# Patient Record
Sex: Male | Born: 1965 | Race: White | Hispanic: No | Marital: Married | Smoking: Never smoker
Health system: Southern US, Community
[De-identification: ages and names within clinical notes are randomized; demographics above are authoritative.]

## PROBLEM LIST (undated history)

## (undated) DIAGNOSIS — I4891 Unspecified atrial fibrillation: Secondary | ICD-10-CM

## (undated) DIAGNOSIS — G4730 Sleep apnea, unspecified: Secondary | ICD-10-CM

## (undated) HISTORY — DX: Unspecified atrial fibrillation: I48.91

## (undated) HISTORY — PX: OTHER SURGICAL HISTORY: SHX169

## (undated) HISTORY — PX: APPENDECTOMY: SHX54

---

## 2005-12-14 ENCOUNTER — Ambulatory Visit (HOSPITAL_COMMUNITY): Admission: RE | Admit: 2005-12-14 | Discharge: 2005-12-14 | Payer: Self-pay | Admitting: Internal Medicine

## 2005-12-18 ENCOUNTER — Ambulatory Visit: Payer: Self-pay | Admitting: Internal Medicine

## 2006-01-01 ENCOUNTER — Ambulatory Visit: Payer: Self-pay

## 2006-01-01 ENCOUNTER — Encounter: Payer: Self-pay | Admitting: Cardiology

## 2006-01-11 ENCOUNTER — Ambulatory Visit: Payer: Self-pay | Admitting: Internal Medicine

## 2007-03-12 ENCOUNTER — Emergency Department: Payer: Self-pay | Admitting: Emergency Medicine

## 2007-03-12 ENCOUNTER — Other Ambulatory Visit: Payer: Self-pay

## 2007-12-13 ENCOUNTER — Emergency Department (HOSPITAL_COMMUNITY): Admission: EM | Admit: 2007-12-13 | Discharge: 2007-12-13 | Payer: Self-pay | Admitting: Emergency Medicine

## 2009-05-31 ENCOUNTER — Emergency Department (HOSPITAL_COMMUNITY): Admission: EM | Admit: 2009-05-31 | Discharge: 2009-05-31 | Payer: Self-pay | Admitting: Emergency Medicine

## 2014-04-25 ENCOUNTER — Other Ambulatory Visit (HOSPITAL_COMMUNITY): Payer: Self-pay | Admitting: Physician Assistant

## 2014-04-25 ENCOUNTER — Ambulatory Visit (HOSPITAL_COMMUNITY)
Admission: RE | Admit: 2014-04-25 | Discharge: 2014-04-25 | Disposition: A | Discharge status: Home / Self Care | Payer: BC Managed Care – PPO | Source: Ambulatory Visit | Attending: Physician Assistant | Admitting: Physician Assistant

## 2014-04-25 DIAGNOSIS — M47812 Spondylosis without myelopathy or radiculopathy, cervical region: Secondary | ICD-10-CM | POA: Insufficient documentation

## 2014-04-25 DIAGNOSIS — R5381 Other malaise: Secondary | ICD-10-CM

## 2014-04-25 DIAGNOSIS — G43909 Migraine, unspecified, not intractable, without status migrainosus: Secondary | ICD-10-CM

## 2014-04-25 DIAGNOSIS — R5383 Other fatigue: Secondary | ICD-10-CM

## 2014-04-25 DIAGNOSIS — R079 Chest pain, unspecified: Secondary | ICD-10-CM

## 2015-04-03 ENCOUNTER — Encounter: Payer: Self-pay | Admitting: *Deleted

## 2015-04-05 ENCOUNTER — Encounter: Payer: Self-pay | Admitting: Urology

## 2015-04-05 ENCOUNTER — Ambulatory Visit (INDEPENDENT_AMBULATORY_CARE_PROVIDER_SITE_OTHER): Payer: BC Managed Care – PPO | Admitting: Urology

## 2015-04-05 VITALS — BP 131/80 | HR 77 | Ht 72.0 in | Wt 170.3 lb

## 2015-04-05 DIAGNOSIS — Z309 Encounter for contraceptive management, unspecified: Secondary | ICD-10-CM

## 2015-04-05 DIAGNOSIS — Z3009 Encounter for other general counseling and advice on contraception: Secondary | ICD-10-CM

## 2015-04-05 MED ORDER — DIAZEPAM 10 MG PO TABS: 10.0000 mg | ORAL_TABLET | Freq: Once | ORAL | Status: DC

## 2015-04-05 NOTE — Progress Notes (Signed)
04/05/2015 1:02 PM   Martin Becker July 12, 1966 [id number]  Referring provider: Shawnie Dapper, PA-C [address] [place], [place] [address]  Chief Complaint  Patient presents with  . Advice Only    Vasectomy    HPI: Martin Becker is a 49 y/o white male who presents today with his girlfriend desiring a vasectomy for birth control.  He has two children and he and his girlfriend do not desire any more children.  He has read the vasectomy literature.  I have explained the risk factors and answered his questions.  I reiterated that this is a permanent form of birth control.  I also explained the post procedural course and what he could expect after the procedure.  He will have a Valium prescribed and his girlfriend will drive him to the appointment and drive him home that day.    He does not have a family history of PCa, so PSA screening is not warranted at this time.    PMH: Past Medical History  Diagnosis Date  . Atrial fibrillation     fixed by ablation    Surgical History: Past Surgical History  Procedure Laterality Date  . Heart ablation      Home Medications:    Medication List       This list is accurate as of: 04/05/15 11:59 PM.  Always use your most recent med list.               acetaminophen 325 MG tablet  Commonly known as:  TYLENOL  Take 650 mg by mouth every 6 (six) hours as needed.     aspirin-acetaminophen-caffeine 250-250-65 MG per tablet  Commonly known as:  EXCEDRIN MIGRAINE  Take by mouth every 6 (six) hours as needed for headache.     diazepam 10 MG tablet  Commonly known as:  VALIUM  Take 1 tablet (10 mg total) by mouth once.     MULTI VITAMIN MENS PO  Take by mouth daily.        Allergies:  Allergies  Allergen Reactions  . Penicillins Nausea And Vomiting    Family History: No family history on file.  Social History:  reports that he has never smoked. He does not have any smokeless tobacco history on file. He reports that  he does not drink alcohol or use illicit drugs.  ROS: Urological Symptom Review  Patient is experiencing the following symptoms: Frequent urination Get up at night to urinate   Review of Systems  Gastrointestinal (upper)  : Negative for upper GI symptoms  Gastrointestinal (lower) : Negative for lower GI symptoms  Constitutional : Negative for symptoms  Skin: Negative for skin symptoms  Eyes: Negative for eye symptoms  Ear/Nose/Throat : Negative for Ear/Nose/Throat symptoms  Hematologic/Lymphatic: Negative for Hematologic/Lymphatic symptoms  Cardiovascular : Negative for cardiovascular symptoms  Respiratory : Negative for respiratory symptoms  Endocrine: Negative for endocrine symptoms  Musculoskeletal: Negative for musculoskeletal symptoms  Neurological: Negative for neurological symptoms  Psychologic: Negative for psychiatric symptoms   Physical Exam: BP 131/80 mmHg  Pulse 77  Ht 6' (1.829 m)  Wt 170 lb 4.8 oz (77.248 kg)  BMI 23.09 kg/m2  GU: Patient with an uncircumcised phallus. Foreskin easily retracted.  Urethral meatus is patent.  No penile discharge. No penile lesions or rashes. Scrotum without lesions, cysts, rashes and/or edema.  Testicles are located scrotally bilaterally. No masses are appreciated in the testicles. Left and right epididymis are normal.  Laboratory Data: No results found for:  WBC, HGB, HCT, MCV, PLT  No results found for: CREATININE  No results found for: PSA  No results found for: TESTOSTERONE  No results found for: HGBA1C  Urinalysis No results found for: COLORURINE, APPEARANCEUR, LABSPEC, PHURINE, GLUCOSEU, HGBUR, BILIRUBINUR, KETONESUR, PROTEINUR, UROBILINOGEN, NITRITE, LEUKOCYTESUR  Pertinent Imaging:   Assessment & Plan:   Discussion: Vasectomy Consult Note Previous fertility: YES Number of children: TWO Current partner: GIRLFRIEND Previous forms of birth control: OCP History of chronic prostatitis,  epididymitis, orchitis, or other genital pain: NONE Today, we discussed what the vas deferens is, where it is located, and its function. We reviewed the procedure for vasectomy, it's risks, benefits, alternatives, and likelihood of achieving his goals. We discussed in detail the procedure, complications, and recovery as well as the need for clearance prior to unprotected intercourse. We discussed that vasectomy does not protect against sexually transmitted diseases. We discussed that this procedure does not result in immediate sterility and that they would need to use other forms of birth control until he has been cleared with a three month negative postvasectomy semen analyses. I explained that the procedure is considered to be permanent and that attempts at reversal have varying degrees of success. These options include vasectomy reversal, sperm retrieval, and in vitro fertilization; these can be very expensive. We discussed the chance of postvasectomy pain syndrome which occurs in less than 5% of patients. I explained to the patient that there is no treatment to resolve this chronic pain, and that if it developed I would not be able to help resolve the issue, but that surgery is generally not needed for correction. I explained there have even been reports of systemic like illness associated with this chronic pain, and that there was no good cure. I explained that vasectomy it is not a 100% reliable form of birth control, and the risk of pregnancy after vasectomy is approximately 1 in 2000 men who had a negative postvasectomy semen analysis or rare non-motile sperm. I explained that repeat vasectomy was necessary in less than 1% of vasectomy procedures when employing the type of technique that is preformed in this office. I explained that he should refrain from ejaculation for approximately one week following vasectomy. I explained that there are other options for birth control which are permanent and  non-permanent; we discussed these. I explained the rates of surgical complications, such as symptomatic hematoma or infection, are low (1-2%) and vary with the surgeon's experience and criteria used to diagnose the complication.  The patient had the opportunity to ask questions to his stated satisfaction. He voiced understanding of the above factors and stated that he has read all the information provided to him and the packets and informed consent.   There are no diagnoses linked to this encounter.  No Follow-up on file.  Michiel Cowboy, PA-C  [facility] [address] [place], [place] [address] [phone]

## 2015-05-03 ENCOUNTER — Ambulatory Visit (INDEPENDENT_AMBULATORY_CARE_PROVIDER_SITE_OTHER): Payer: BC Managed Care – PPO | Admitting: Urology

## 2015-05-03 VITALS — BP 131/86 | HR 68 | Ht 72.0 in | Wt 167.4 lb

## 2015-05-03 DIAGNOSIS — Z302 Encounter for sterilization: Secondary | ICD-10-CM | POA: Diagnosis not present

## 2015-05-03 NOTE — Progress Notes (Signed)
Bilateral Vasectomy Procedure  Pre-Procedure: - Patient's scrotum was prepped and draped for vasectomy. - The vas was palpated through the scrotal skin on the left. - 1% Xylocaine was injected into the skin and surrounding tissue for placement  - In a similar manner, the vas on the right was identified, anesthetized, and stabilized.  Procedure: - A blade technique was used to open the overlying skin (sharp dissection) - The left vas was isolated and brought up through the incision exposing that structure. - Bleeding points were cauterized as they occurred. - The vas was free from the surrounding structures and brought to the view. - A segment was positioned for placement with a hemostat. - A second hemostat was placed and a small segment between the two hemostats and was removed for inspection. - Each end of the transected vas lumen was fulgurated/ obliterated using needlepoint electrocautery -A fascial interposition was performed on testicular end of the vas using #3-0 chromic suture -The same procedure was performed on the right. - A single suture of #3-0 chromic catgut was used to close each midline scrotal skin incision - A dressing was applied.  Post-Procedure: - Patient was instructed in care of the operative area - A specimen is to be delivered in 12 weeks   -Another form of contraception is to be used until

## 2015-05-03 NOTE — Progress Notes (Signed)
Patient is here for as ectomy. Thorough review of palpitations as ectomy including infection bleeding reanastomosis and excessive cost of 3 anastomosis have been reviewed with the patient.  Physical exam testes descended and normal masses easily felt no vaginitis. Vital signs stable. Permit signed and timeout done.

## 2015-07-23 ENCOUNTER — Other Ambulatory Visit: Payer: BC Managed Care – PPO

## 2015-07-23 DIAGNOSIS — N469 Male infertility, unspecified: Secondary | ICD-10-CM

## 2015-07-24 LAB — POST-VAS SPERM EVALUATION,QUAL
Post-Vas Sperm, Qual: ABSENT
SEMEN VOLUME: 2.1 mL

## 2015-07-25 ENCOUNTER — Telehealth: Payer: Self-pay

## 2015-07-25 NOTE — Telephone Encounter (Signed)
-----   Message from Harle Battiest, PA-C sent at 07/25/2015  8:32 AM EDT ----- Patient's semen analysis is negative for sperm.

## 2015-07-25 NOTE — Telephone Encounter (Signed)
Spoke with pt in reference to semen analysis. Pt voiced understanding.  

## 2016-08-18 ENCOUNTER — Encounter: Payer: Self-pay | Admitting: Cardiovascular Disease

## 2016-08-23 ENCOUNTER — Encounter (HOSPITAL_COMMUNITY): Payer: Self-pay | Admitting: *Deleted

## 2016-08-23 ENCOUNTER — Telehealth (HOSPITAL_COMMUNITY): Payer: Self-pay

## 2016-08-23 ENCOUNTER — Emergency Department (HOSPITAL_COMMUNITY): Payer: BC Managed Care – PPO

## 2016-08-23 ENCOUNTER — Emergency Department (HOSPITAL_COMMUNITY)
Admission: EM | Admit: 2016-08-23 | Discharge: 2016-08-23 | Disposition: A | Discharge status: Home / Self Care | Payer: BC Managed Care – PPO | Attending: Emergency Medicine | Admitting: Emergency Medicine

## 2016-08-23 DIAGNOSIS — R0789 Other chest pain: Secondary | ICD-10-CM | POA: Diagnosis present

## 2016-08-23 DIAGNOSIS — Z7982 Long term (current) use of aspirin: Secondary | ICD-10-CM | POA: Diagnosis not present

## 2016-08-23 LAB — CBC
HCT: 45.7 % (ref 39.0–52.0)
Hemoglobin: 16.6 g/dL (ref 13.0–17.0)
MCH: 30.1 pg (ref 26.0–34.0)
MCHC: 36.3 g/dL — AB (ref 30.0–36.0)
MCV: 82.9 fL (ref 78.0–100.0)
PLATELETS: 200 10*3/uL (ref 150–400)
RBC: 5.51 MIL/uL (ref 4.22–5.81)
RDW: 12.1 % (ref 11.5–15.5)
WBC: 8.2 10*3/uL (ref 4.0–10.5)

## 2016-08-23 LAB — BASIC METABOLIC PANEL
Anion gap: 6 (ref 5–15)
BUN: 12 mg/dL (ref 6–20)
CHLORIDE: 104 mmol/L (ref 101–111)
CO2: 27 mmol/L (ref 22–32)
CREATININE: 0.94 mg/dL (ref 0.61–1.24)
Calcium: 9.6 mg/dL (ref 8.9–10.3)
GFR calc non Af Amer: >60 mL/min (ref >60)
GLUCOSE: 82 mg/dL (ref 65–99)
Potassium: 3.9 mmol/L (ref 3.5–5.1)
Sodium: 137 mmol/L (ref 135–145)

## 2016-08-23 LAB — I-STAT TROPONIN, ED
Troponin i, poc: 0 ng/mL (ref 0.00–0.08)
Troponin i, poc: 0 ng/mL (ref 0.00–0.08)

## 2016-08-23 MED ORDER — GI COCKTAIL ~~LOC~~
30.0000 mL | Freq: Once | ORAL | Status: AC
  Administered 2016-08-23: 30 mL via ORAL
  Filled 2016-08-23: qty 30

## 2016-08-23 MED ORDER — GI COCKTAIL ~~LOC~~: 30.0000 mL | Freq: Once | ORAL | 1 refills | Status: AC | PRN

## 2016-08-23 MED ORDER — FAMOTIDINE 40 MG PO TABS: 40.0000 mg | ORAL_TABLET | Freq: Every day | ORAL | 0 refills | Status: DC

## 2016-08-23 NOTE — Telephone Encounter (Signed)
Pharmacy calling for clarification of contents in GI cocktail.  [facility] contents per 30 ml of GI cocktail include 12 ml Maalox, 12 ml Visc lidocaine and 6 ml Donnatal.  Pharmacy informed.

## 2016-08-23 NOTE — ED Provider Notes (Signed)
[facility]-EMERGENCY DEPT Provider Note   CSN: [id number] Arrival date & time: 08/23/16  1308     History   Chief Complaint Chief Complaint  Patient presents with  . Chest Pain    HPI Martin Becker is a 50 y.o. male who presents with chest pressure. PMH significant for SVT s/p ablation in his late 4920s. He states that the pressure started one week ago. It is constant, worse when he lies flat or reclines. Pain is not worse with exertion. Denies fever, chills, sweats, palpitations, leg swelling, SOB, cough, abdominal pain, N/V. He went to his PCP 5 days ago who did an EKG and blood work. They thought it may be GI related and put him on Omeprazole once daily. He states that this med has not helped much. They scheduled him for outpatient stress test this upcoming Tuesday. Today the pressure moved to the upper left chest and in to the L shoulder today and he was unable to do his daily push ups due to pain therefore came to the ED for further evaluation. Denies hx of HTN, HLD, DM, and is a non-smoker. Never had a cath. Last stress test and echo was in 2007 which was unremarkable. Patient states he is worried because his father had many problems with his heart therefore he tries to take care of himself.  HPI  Past Medical History:  Diagnosis Date  . Atrial fibrillation (HCC)    fixed by ablation    There are no active problems to display for this patient.   Past Surgical History:  Procedure Laterality Date  . APPENDECTOMY    . heart ablation         Home Medications    Prior to Admission medications   Medication Sig Start Date End Date Taking? Authorizing Provider  acetaminophen (TYLENOL) 325 MG tablet Take 650 mg by mouth every 6 (six) hours as needed.    Historical Provider, MD  aspirin-acetaminophen-caffeine (EXCEDRIN MIGRAINE) [id number] MG per tablet Take by mouth every 6 (six) hours as needed for headache.    Historical Provider, MD  diazepam (VALIUM) 10 MG tablet Take 1  tablet (10 mg total) by mouth once. 04/05/15   Harle BattiestShannon A McGowan, PA-C  Multiple Vitamin (MULTI VITAMIN MENS PO) Take by mouth daily.    Historical Provider, MD    Family History No family history on file.  Social History Social History  Substance Use Topics  . Smoking status: Never Smoker  . Smokeless tobacco: Never Used  . Alcohol use No     Allergies   Penicillins   Review of Systems Review of Systems  Constitutional: Negative for chills, diaphoresis and fever.  Respiratory: Negative for cough and shortness of breath.   Cardiovascular: Positive for chest pain. Negative for palpitations and leg swelling.  Gastrointestinal: Negative for abdominal pain, nausea and vomiting.  All other systems reviewed and are negative.    Physical Exam Updated Vital Signs BP (!) 160/117 (BP Location: Right Arm)   Pulse 66   Temp 98.1 F (36.7 C) (Oral)   Resp 16   SpO2 98%   Physical Exam  Constitutional: He is oriented to person, place, and time. He appears well-developed and well-nourished. No distress.  HENT:  Head: Normocephalic and atraumatic.  Eyes: Conjunctivae are normal. Pupils are equal, round, and reactive to light. Right eye exhibits no discharge. Left eye exhibits no discharge. No scleral icterus.  Neck: Normal range of motion. Neck supple.  Cardiovascular: Normal rate and regular rhythm.  Exam reveals no gallop and no friction rub.   No murmur heard. Pulmonary/Chest: Effort normal and breath sounds normal. No respiratory distress. He has no wheezes. He has no rales. He exhibits no tenderness.  Abdominal: Soft. Bowel sounds are normal. He exhibits no distension and no mass. There is no tenderness. There is no rebound and no guarding. No hernia.  Musculoskeletal: He exhibits no edema.  Neurological: He is alert and oriented to person, place, and time.  Skin: Skin is warm and dry.  Psychiatric: He has a normal mood and affect.  Nursing note and vitals reviewed.    ED  Treatments / Results  Labs (all labs ordered are listed, but only abnormal results are displayed) Labs Reviewed  CBC - Abnormal; Notable for the following:       Result Value   MCHC 36.3 (*)    All other components within normal limits  BASIC METABOLIC PANEL  I-STAT TROPOININ, ED  I-STAT TROPOININ, ED    EKG  EKG Interpretation  Date/Time:  Sunday August 23 2016 13:11:03 EDT Ventricular Rate:  74 PR Interval:  164 QRS Duration: 100 QT Interval:  382 QTC Calculation: 424 R Axis:   71 Text Interpretation:  Normal sinus rhythm Incomplete right bundle branch block Borderline ECG no significant change since 2008 Confirmed by GOLDSTON MD, SCOTT [phone]([id number]) on 08/23/2016 1:51:46 PM       Radiology Dg Chest 2 View  Result Date: 08/23/2016 CLINICAL DATA:  Intermittent left chest pain for the past 2 weeks, worse today. Left arm weakness. EXAM: CHEST  2 VIEW COMPARISON:  04/25/2014. FINDINGS: Normal sized heart. Clear lungs with normal vascularity. Normal appearing bones. IMPRESSION: Normal examination. Electronically Signed   By: Beckie SaltsSteven  Reid M.D.   On: 08/23/2016 14:23    Procedures Procedures (including critical care time)  Medications Ordered in ED Medications  gi cocktail (Maalox,Lidocaine,Donnatal) (30 mLs Oral Given 08/23/16 1542)     Initial Impression / Assessment and Plan / ED Course  I have reviewed the triage vital signs and the nursing notes.  Pertinent labs & imaging results that were available during my care of the patient were reviewed by me and considered in my medical decision making (see chart for details).  Clinical Course   50 year old male with atypical chest pain. Chest pain work up is reassuring. EKG is NSR and shows no significant change since last. CXR is negative. Initial and second troponin are 0. Labs are unremarkable. No significant past hx of cardiac disease. Patient is non-smoker. HEART score is 2. PERC neg. GI cocktail given and patient reports  significant relief. F/u with PCP for stress test on Tuesday. Patient is NAD, non-toxic, with stable VS. Patient is informed of clinical course, understands medical decision making process, and agrees with plan. Opportunity for questions provided and all questions answered. Return precautions given.   Final Clinical Impressions(s) / ED Diagnoses   Final diagnoses:  Atypical chest pain    New Prescriptions New Prescriptions   No medications on file     Bethel BornKelly Marie Versia Mignogna, PA-C 08/23/16 1935    Pricilla LovelessScott Goldston, MD 09/01/16 1704

## 2016-08-23 NOTE — Discharge Instructions (Signed)
Take Pepcid once a day. Take GI cocktail as needed Follow up with PCP for stress test

## 2016-08-23 NOTE — ED Triage Notes (Signed)
Pt states intermittent chest heaviness off and on for the last week.  Pt has been on omeprazole and no change and is scheduled for an outpt stress test. Pt feels like weakness ot left shoulder area, no grip deficits. Pt has had ablation before

## 2016-08-25 ENCOUNTER — Ambulatory Visit: Payer: BC Managed Care – PPO | Admitting: Internal Medicine

## 2018-05-08 ENCOUNTER — Emergency Department
Admission: EM | Admit: 2018-05-08 | Discharge: 2018-05-08 | Disposition: A | Discharge status: Home / Self Care | Payer: BC Managed Care – PPO | Attending: Emergency Medicine | Admitting: Emergency Medicine

## 2018-05-08 ENCOUNTER — Emergency Department: Payer: BC Managed Care – PPO

## 2018-05-08 ENCOUNTER — Other Ambulatory Visit: Payer: Self-pay

## 2018-05-08 DIAGNOSIS — Z79899 Other long term (current) drug therapy: Secondary | ICD-10-CM | POA: Diagnosis not present

## 2018-05-08 DIAGNOSIS — R079 Chest pain, unspecified: Secondary | ICD-10-CM | POA: Insufficient documentation

## 2018-05-08 HISTORY — DX: Sleep apnea, unspecified: G47.30

## 2018-05-08 LAB — CBC
HCT: 43.7 % (ref 40.0–52.0)
Hemoglobin: 15.7 g/dL (ref 13.0–18.0)
MCH: 30.2 pg (ref 26.0–34.0)
MCHC: 36 g/dL (ref 32.0–36.0)
MCV: 83.8 fL (ref 80.0–100.0)
PLATELETS: 200 10*3/uL (ref 150–440)
RBC: 5.21 MIL/uL (ref 4.40–5.90)
RDW: 12.8 % (ref 11.5–14.5)
WBC: 8.4 10*3/uL (ref 3.8–10.6)

## 2018-05-08 LAB — BASIC METABOLIC PANEL
Anion gap: 8 (ref 5–15)
BUN: 15 mg/dL (ref 6–20)
CALCIUM: 9.1 mg/dL (ref 8.9–10.3)
CHLORIDE: 107 mmol/L (ref 98–111)
CO2: 25 mmol/L (ref 22–32)
CREATININE: 1.02 mg/dL (ref 0.61–1.24)
GFR calc Af Amer: >60 mL/min (ref >60)
GFR calc non Af Amer: >60 mL/min (ref >60)
GLUCOSE: 132 mg/dL — AB (ref 70–99)
Potassium: 3.9 mmol/L (ref 3.5–5.1)
Sodium: 140 mmol/L (ref 135–145)

## 2018-05-08 LAB — TROPONIN I
Troponin I: <0.03 ng/mL (ref <0.03)
Troponin I: <0.03 ng/mL (ref <0.03)

## 2018-05-08 MED ORDER — GI COCKTAIL ~~LOC~~
30.0000 mL | Freq: Once | ORAL | Status: AC
  Administered 2018-05-08: 30 mL via ORAL
  Filled 2018-05-08: qty 30

## 2018-05-08 MED ORDER — ASPIRIN 81 MG PO CHEW
324.0000 mg | CHEWABLE_TABLET | Freq: Once | ORAL | Status: AC
  Administered 2018-05-08: 324 mg via ORAL
  Filled 2018-05-08: qty 4

## 2018-05-08 NOTE — ED Provider Notes (Signed)
[facility] Emergency Department Provider Note ____________________________________________   First MD Initiated Contact with Patient 05/08/18 1510     (approximate)  I have reviewed the triage vital signs and the nursing notes.   HISTORY  Chief Complaint Chest Pain  HPI Martin Becker is a 52 y.o. male here for evaluation of chest discomfort.  Patient reports he is been experiencing a pressure in the left side of his chest for about the last 24 hours.  It radiates slightly towards his left upper chest, feels like a very slight tingling at times in his arm.  Does report he is a history of similar symptoms with acid reflux previously, reports when this happened before he was given a GI cocktail and started Protonix and that helped.  He currently occasionally takes acid medicine, took 1 tablet earlier today.  No other medications.  Reports a improvement but he still mild ongoing discomfort left upper chest.   Past Medical History:  Diagnosis Date  . Atrial fibrillation (HCC)    fixed by ablation  . Sleep apnea     There are no active problems to display for this patient.   Past Surgical History:  Procedure Laterality Date  . APPENDECTOMY    . heart ablation      Prior to Admission medications   Medication Sig Start Date End Date Taking? Authorizing Provider  Alum & Mag Hydroxide-Simeth (GI COCKTAIL) SUSP suspension Take 30 mLs by mouth once as needed for indigestion. Shake well. 08/23/16   Bethel BornGekas, Kelly Marie, PA-C  B Complex-C (B-COMPLEX WITH VITAMIN C) tablet Take 1 tablet by mouth daily.    [provider]  famotidine (PEPCID) 40 MG tablet Take 1 tablet (40 mg total) by mouth daily. 08/23/16   Bethel BornGekas, Kelly Marie, PA-C  Multiple Vitamin (MULTI VITAMIN MENS PO) Take by mouth daily.    [provider]  omeprazole (PRILOSEC) 40 MG capsule Take 40 mg by mouth daily.    [provider]    Allergies Penicillins  History  reviewed. No pertinent family history.  Social History Social History   Tobacco Use  . Smoking status: Never Smoker  . Smokeless tobacco: Never Used  Substance Use Topics  . Alcohol use: No    Alcohol/week: 0.0 oz  . Drug use: No    Review of Systems Constitutional: No fever/chills Eyes: No visual changes. ENT: No sore throat. Cardiovascular: See HPI  respiratory: Denies shortness of breath. Gastrointestinal: No abdominal pain.  No nausea, no vomiting.  No diarrhea.  No constipation. Genitourinary: Negative for dysuria. Musculoskeletal: Negative for back pain. Skin: Negative for rash. Neurological: Negative for headaches, focal weakness or numbness.  No pain with deep inspiration. ____________________________________________   PHYSICAL EXAM:  VITAL SIGNS: ED Triage Vitals  Enc Vitals Group     BP 05/08/18 1257 (!) 155/90     Pulse Rate 05/08/18 1257 72     Resp 05/08/18 1257 18     Temp 05/08/18 1257 98 F (36.7 C)     Temp Source 05/08/18 1257 Oral     SpO2 05/08/18 1257 100 %     Weight 05/08/18 1254 195 lb (88.5 kg)     Height 05/08/18 1254 6' (1.829 m)     Head Circumference --      Peak Flow --      Pain Score 05/08/18 1254 6     Pain Loc --      Pain Edu? --  Excl. in GC? --     Constitutional: Alert and oriented. Well appearing and in no acute distress. Eyes: Conjunctivae are normal. Head: Atraumatic. Nose: No congestion/rhinnorhea. Mouth/Throat: Mucous membranes are moist. Neck: No stridor.   Cardiovascular: Normal rate, regular rhythm. Grossly normal heart sounds.  Good peripheral circulation. Respiratory: Normal respiratory effort.  No retractions. Lungs CTAB. Gastrointestinal: Soft and nontender. No distention.  No epigastric tenderness.  No pain in the right upper quadrant.  Negative Murphy. Musculoskeletal: No lower extremity tenderness nor edema. Neurologic:  Normal speech and language. No gross focal neurologic deficits are appreciated.   Skin:  Skin is warm, dry and intact. No rash noted. Psychiatric: Mood and affect are normal. Speech and behavior are normal.  ____________________________________________   LABS (all labs ordered are listed, but only abnormal results are displayed)  Labs Reviewed  BASIC METABOLIC PANEL - Abnormal; Notable for the following components:      Result Value   Glucose, Bld 132 (*)    All other components within normal limits  CBC  TROPONIN I  TROPONIN I   ____________________________________________  EKG  Reviewed and interpreted by me at 1510 Heart rate 70 QRS 100 QTc 440 Normal sinus rhythm, somewhat of an incomplete right bundle branch block appearance is noted, and there is a nonspecific T wave including some very minimal ST change in V2 with less than 1 mm ST elevation.  It appears somewhat similar to his previous EKG from October 2017, but there is now a slight nonspecific change  EKG repeated at 1600 for observation Heart rate 60 QRS 110 QTc 410 Normal sinus rhythm, again seen right bundle branch block appearance, again seen a mild persistent ST abnormality in V2.  Compared with prior from today, no significant changes noted at this time ____________________________________________  RADIOLOGY    Chest x-ray is negative for acute ____________________________________________   PROCEDURES  Procedure(s) performed: None  Procedures  Critical Care performed: No  ____________________________________________   INITIAL IMPRESSION / ASSESSMENT AND PLAN / ED COURSE  Pertinent labs & imaging results that were available during my care of the patient were reviewed by me and considered in my medical decision making (see chart for details).    Differential diagnosis includes, but is not limited to, ACS, aortic dissection, pulmonary embolism, cardiac tamponade, pneumothorax, pneumonia, pericarditis, myocarditis, GI-related causes including esophagitis/gastritis, and  musculoskeletal chest wall pain.     Clinical Course as of May 08 1718  Sun May 08, 2018  1709 Trop 2 normal, discussed with Dr. Herbie Baltimore (cardiology). Reviewed clinical history, Dr. Herbie Baltimore reviewed EKGs, labs, etc via [facility] with me. Recommends discharge, follow-up with cardiology outpatient. Daily asa therapy. Follow-up with [facility] cardiology. Dr. Herbie Baltimore will have scheduler call patient for appointment.    [MQ]    Clinical Course User Index [MQ] Sharyn Creamer, MD   ----------------------------------------- 5:18 PM on 05/08/2018 -----------------------------------------  Patient reports feeling improved after GI cocktail.  Resting comfortably.  Reassuring EKGs and troponins.  2 normal troponins.  Return precautions and treatment recommendations and follow-up discussed with the patient who is agreeable with the plan.   ____________________________________________   FINAL CLINICAL IMPRESSION(S) / ED DIAGNOSES  Final diagnoses:  Chest pain, unspecified type      NEW MEDICATIONS STARTED DURING THIS VISIT:  New Prescriptions   No medications on file     Note:  This document was prepared using Dragon voice recognition software and may include unintentional dictation errors.     Sharyn Creamer, MD 05/08/18 1719

## 2018-05-08 NOTE — ED Triage Notes (Signed)
Pt arrives to ED c/o of L CP that began today radiating into L shoulder. Some nausea. Denies vomiting. Alert, oriented, ambulatory. States "the humidity has really been bothering me the past couple days." hx GERD. Took "a stomach pill" this morning. States a lot of burping and gas as well.

## 2018-05-08 NOTE — Discharge Instructions (Addendum)

## 2018-05-09 ENCOUNTER — Telehealth: Payer: Self-pay | Admitting: Internal Medicine

## 2018-05-09 NOTE — Telephone Encounter (Signed)
TCM....  Patient is being discharged   They saw Dr Herbie BaltimoreHarding   They are scheduled to see Dr Graciela HusbandsKlein on 05/12/18  They were seen for CP  They need to be seen within 7-10 days      Please call

## 2018-05-09 NOTE — Telephone Encounter (Signed)
Patient contacted [facility] on 05/08/18.  Patient understands to follow up with provider Dr. Graciela HusbandsKlein on Thursday 05/12/18 at [facility]. Patient understands discharge instructions? Yes Patient understands medications and regiment? Yes Patient understands to bring all medications to this visit? Yes

## 2018-05-12 ENCOUNTER — Encounter: Payer: Self-pay | Admitting: Internal Medicine

## 2018-05-12 ENCOUNTER — Ambulatory Visit: Payer: BC Managed Care – PPO | Admitting: Internal Medicine

## 2018-05-12 VITALS — BP 142/100 | HR 69 | Ht 72.0 in | Wt 196.8 lb

## 2018-05-12 DIAGNOSIS — R079 Chest pain, unspecified: Secondary | ICD-10-CM

## 2018-05-12 NOTE — Patient Instructions (Signed)
Medication Instructions:  Your physician recommends that you continue on your current medications as directed. Please refer to the Current Medication list given to you today.   Labwork: none  Testing/Procedures: none  Follow-Up: Your physician recommends that you schedule a follow-up appointment in: as needed.    

## 2018-05-12 NOTE — Progress Notes (Signed)
ELECTROPHYSIOLOGY OFFICE NOTE  Patient ID: Martin Becker, MRN: [id number], DOB/AGE: 1966/07/20 52 y.o. Admit date: (Not on file) Date of Consult: 05/12/2018  Primary Physician: Patient, No Pcp Per Primary Cardiologist: none     Martin Becker is a 52 y.o. male who is being seen today for the evaluation of CP  at the request of ER .    HPI Martin Becker is a 52 y.o. male referred for evaluation of chest pain.  Seen in ER 05/08/2018 for chest pain of 6-8  hours duration.  It was left-sided with radiation into his arm.  Previously ascribed to reflux.  Improved with a GI cocktail troponins were negative x2 and he was discharged.     I apparently saw him about 12 years ago for palpitations.  He underwent an ablation procedure that was quite complicated apparently and I did with somebody else.  Since that time he has had no recurrent tachycardia.  Echocardiogram at that time was normal.  His weight is up about 20 pounds.  He has had hypertension.  He has been identified as having sleep apnea and is supposed to get his CPAP machine tonight.  Past Medical History:  Diagnosis Date  . Atrial fibrillation (HCC)    fixed by ablation  . Sleep apnea       Surgical History:  Past Surgical History:  Procedure Laterality Date  . APPENDECTOMY    . heart ablation       Home Meds: Prior to Admission medications   Medication Sig Start Date End Date Taking? Authorizing Provider  Alum & Mag Hydroxide-Simeth (GI COCKTAIL) SUSP suspension Take 30 mLs by mouth once as needed for indigestion. Shake well. 08/23/16  Yes Bethel Born, PA-C  B Complex-C (B-COMPLEX WITH VITAMIN C) tablet Take 1 tablet by mouth daily.   Yes [provider]  Multiple Vitamin (MULTI VITAMIN MENS PO) Take by mouth daily.   Yes [provider]  omeprazole (PRILOSEC) 40 MG capsule Take 40 mg by mouth daily.   Yes [provider]    Allergies:  Allergies  Allergen Reactions  .  Penicillins Nausea And Vomiting    Social History   Socioeconomic History  . Marital status: Single    Spouse name: Not on file  . Number of children: Not on file  . Years of education: Not on file  . Highest education level: Not on file  Occupational History  . Not on file  Social Needs  . Financial resource strain: Not on file  . Food insecurity:    Worry: Not on file    Inability: Not on file  . Transportation needs:    Medical: Not on file    Non-medical: Not on file  Tobacco Use  . Smoking status: Never Smoker  . Smokeless tobacco: Never Used  Substance and Sexual Activity  . Alcohol use: No    Alcohol/week: 0.0 oz  . Drug use: No  . Sexual activity: Not on file  Lifestyle  . Physical activity:    Days per week: Not on file    Minutes per session: Not on file  . Stress: Not on file  Relationships  . Social connections:    Talks on phone: Not on file    Gets together: Not on file    Attends religious service: Not on file    Active member of club or organization: Not on file    Attends meetings of clubs or  organizations: Not on file    Relationship status: Not on file  . Intimate partner violence:    Fear of current or ex partner: Not on file    Emotionally abused: Not on file    Physically abused: Not on file    Forced sexual activity: Not on file  Other Topics Concern  . Not on file  Social History Narrative  . Not on file     History reviewed. No pertinent family history.   ROS:  Please see the history of present illness.     All other systems reviewed and negative.    Physical Exam: Blood pressure (!) 142/100, pulse 69, height 6' (1.829 m), weight 196 lb 12 oz (89.2 kg). General: Well developed, well nourished male in no acute distress. Head: Normocephalic, atraumatic, sclera non-icteric, no xanthomas, nares are without discharge. EENT: normal  Lymph Nodes:  none Neck: Negative for carotid bruits. JVD not elevated. Back:without scoliosis  kyphosis Lungs: Clear bilaterally to auscultation without wheezes, rales, or rhonchi. Breathing is unlabored. Heart: RRR with S1 S2. No  murmur . No rubs, or gallops appreciated. Abdomen: Soft, non-tender, non-distended with normoactive bowel sounds. No hepatomegaly. No rebound/guarding. No obvious abdominal masses. Msk:  Strength and tone appear normal for age. Extremities: No clubbing or cyanosis. No edema.  Distal pedal pulses are 2+ and equal bilaterally. Skin: Warm and Dry Neuro: Alert and oriented X 3. CN III-XII intact Grossly normal sensory and motor function . Psych:  Responds to questions appropriately with a normal affect.      Labs: Cardiac Enzymes No results for input(s): CKTOTAL, CKMB, TROPONINI in the last 72 hours. CBC Lab Results  Component Value Date   WBC 8.4 05/08/2018   HGB 15.7 05/08/2018   HCT 43.7 05/08/2018   MCV 83.8 05/08/2018   PLT 200 05/08/2018   PROTIME: No results for input(s): LABPROT, INR in the last 72 hours. Chemistry  Recent Labs  Lab 05/08/18 1256  NA 140  K 3.9  CL 107  CO2 25  BUN 15  CREATININE 1.02  CALCIUM 9.1  GLUCOSE 132*   Lipids No results found for: CHOL, HDL, LDLCALC, TRIG BNP No results found for: PROBNP Thyroid Function Tests: No results for input(s): TSH, T4TOTAL, T3FREE, THYROIDAB in the last 72 hours.  Invalid input(s): FREET3 Miscellaneous No results found for: DDIMER  Radiology/Studies:  Dg Chest 2 View  Result Date: 05/08/2018 CLINICAL DATA:  Chest pain. EXAM: CHEST - 2 VIEW COMPARISON:  Chest x-ray dated August 23, 2016. FINDINGS: The heart size and mediastinal contours are within normal limits. Normal pulmonary vascularity. Minimal scarring at the peripheral left lung base. No focal consolidation, pleural effusion, or pneumothorax. No acute osseous abnormality. IMPRESSION: No active cardiopulmonary disease. Electronically Signed   By: Obie Dredge M.D.   On: 05/08/2018 13:52    EKG: Sinus rhythm at  69 Intervals 18/10/41   Assessment and Plan:  Atypical chest pain  GE reflux disease  SVT status post catheter ablation  Obstructive sleep apnea  Hypertension   The patient has atypical chest pain.  Likelihood that this is cardiac in origin based on its duration, negative troponins, response to GI cocktail are extremely low.  We discussed further diagnostic strategies including calcium scoring and/or CTA.  I think neither of those is clearly indicated at the present time.  He will follow-up with his PCP regarding lipids  His blood pressure is elevated.  He will keep an eye on this following introduction of  CPAP and hopefully weight loss.  If it remains elevated he is been advised to go up with his PCP.  We will see as needed     Sherryl MangesSteven Klein

## 2018-08-01 ENCOUNTER — Other Ambulatory Visit: Payer: Self-pay | Admitting: Urology

## 2018-08-02 LAB — PSA: Prostate Specific Ag, Serum: 0.8 ng/mL (ref 0.0–4.0)

## 2018-11-20 IMAGING — CR DG CHEST 2V
1 series · 2 of 2 positions shown · non-contrast
Comparison: Chest x-ray dated August 23, 2016.

CLINICAL DATA: Chest pain.

EXAM:
CHEST - 2 VIEW

[Series 1: dg chest 2 view · 0.14mm/px · 2 of 2 slices shown]
[im 1/2]
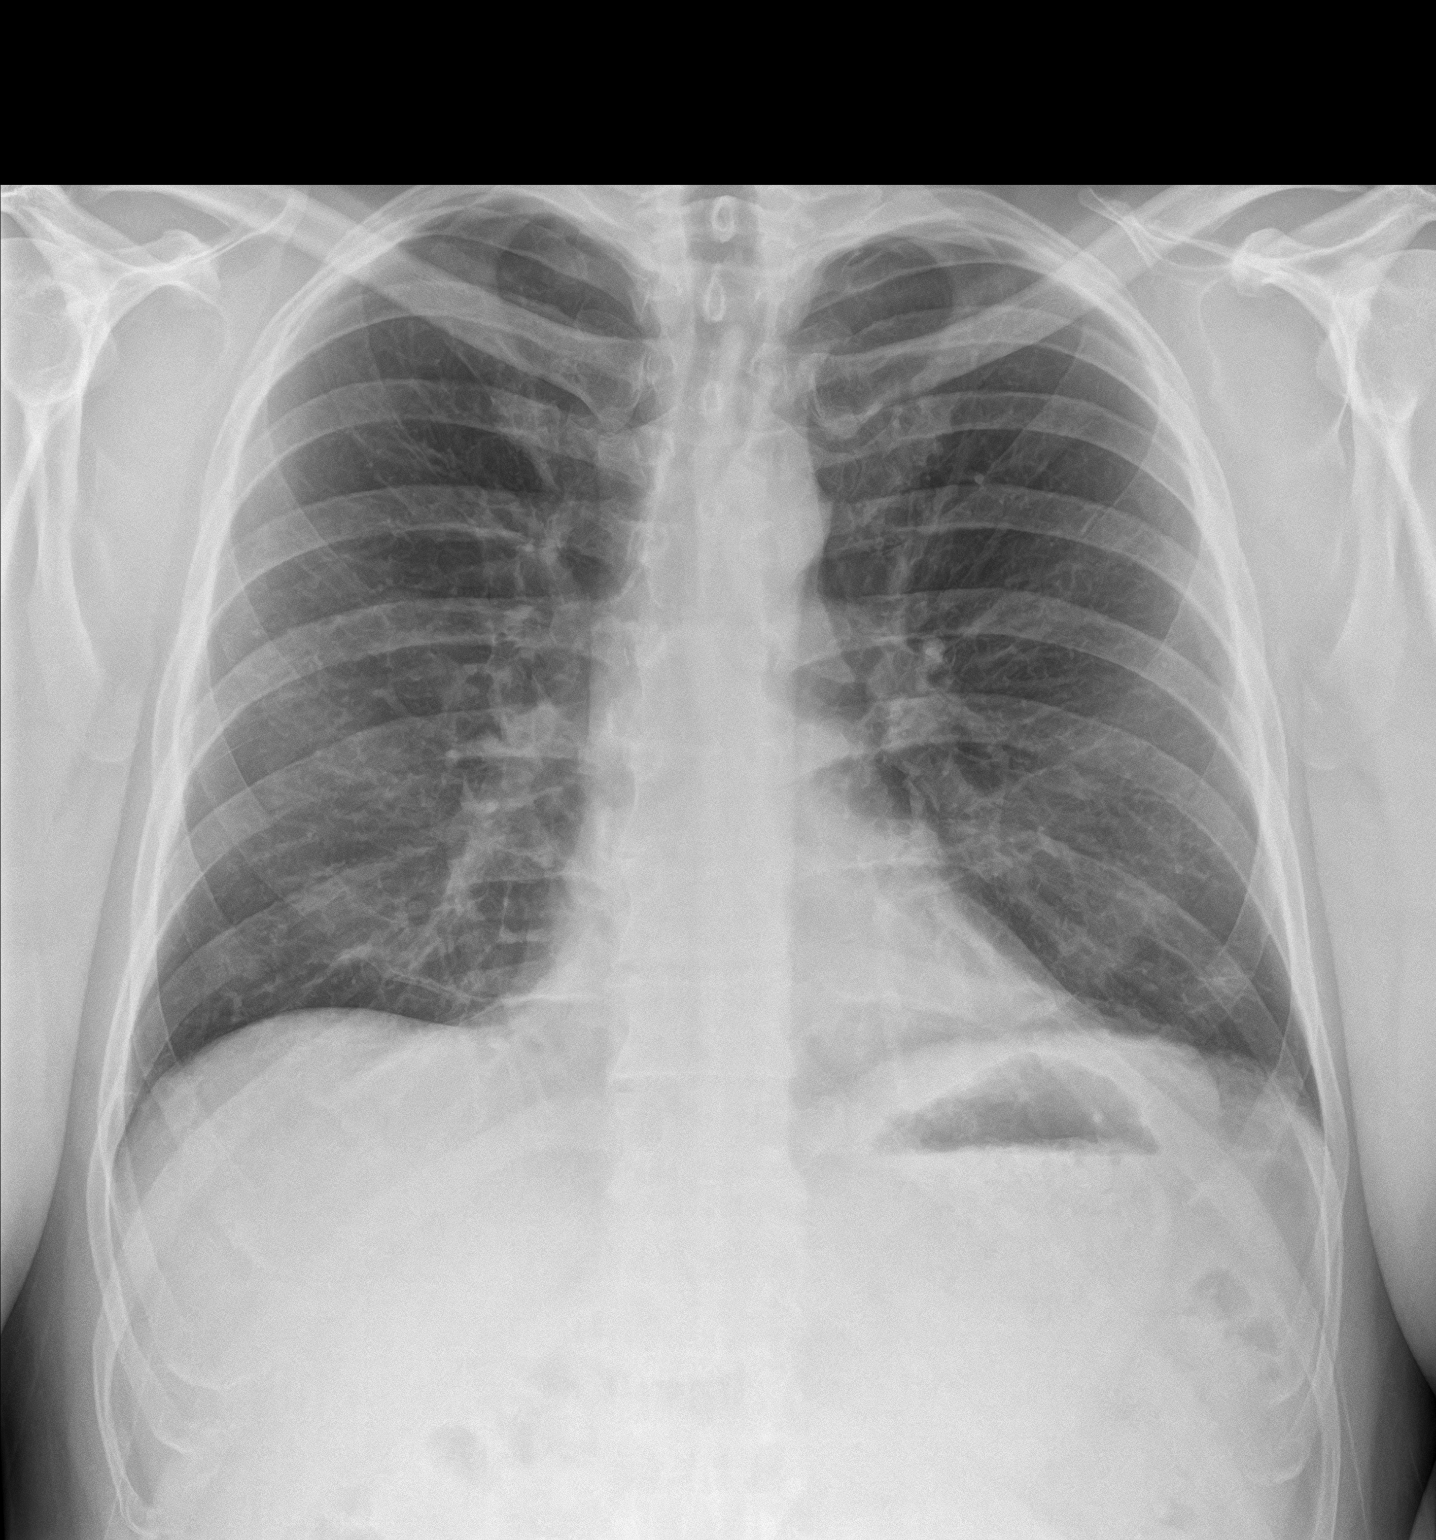
[im 2/2]
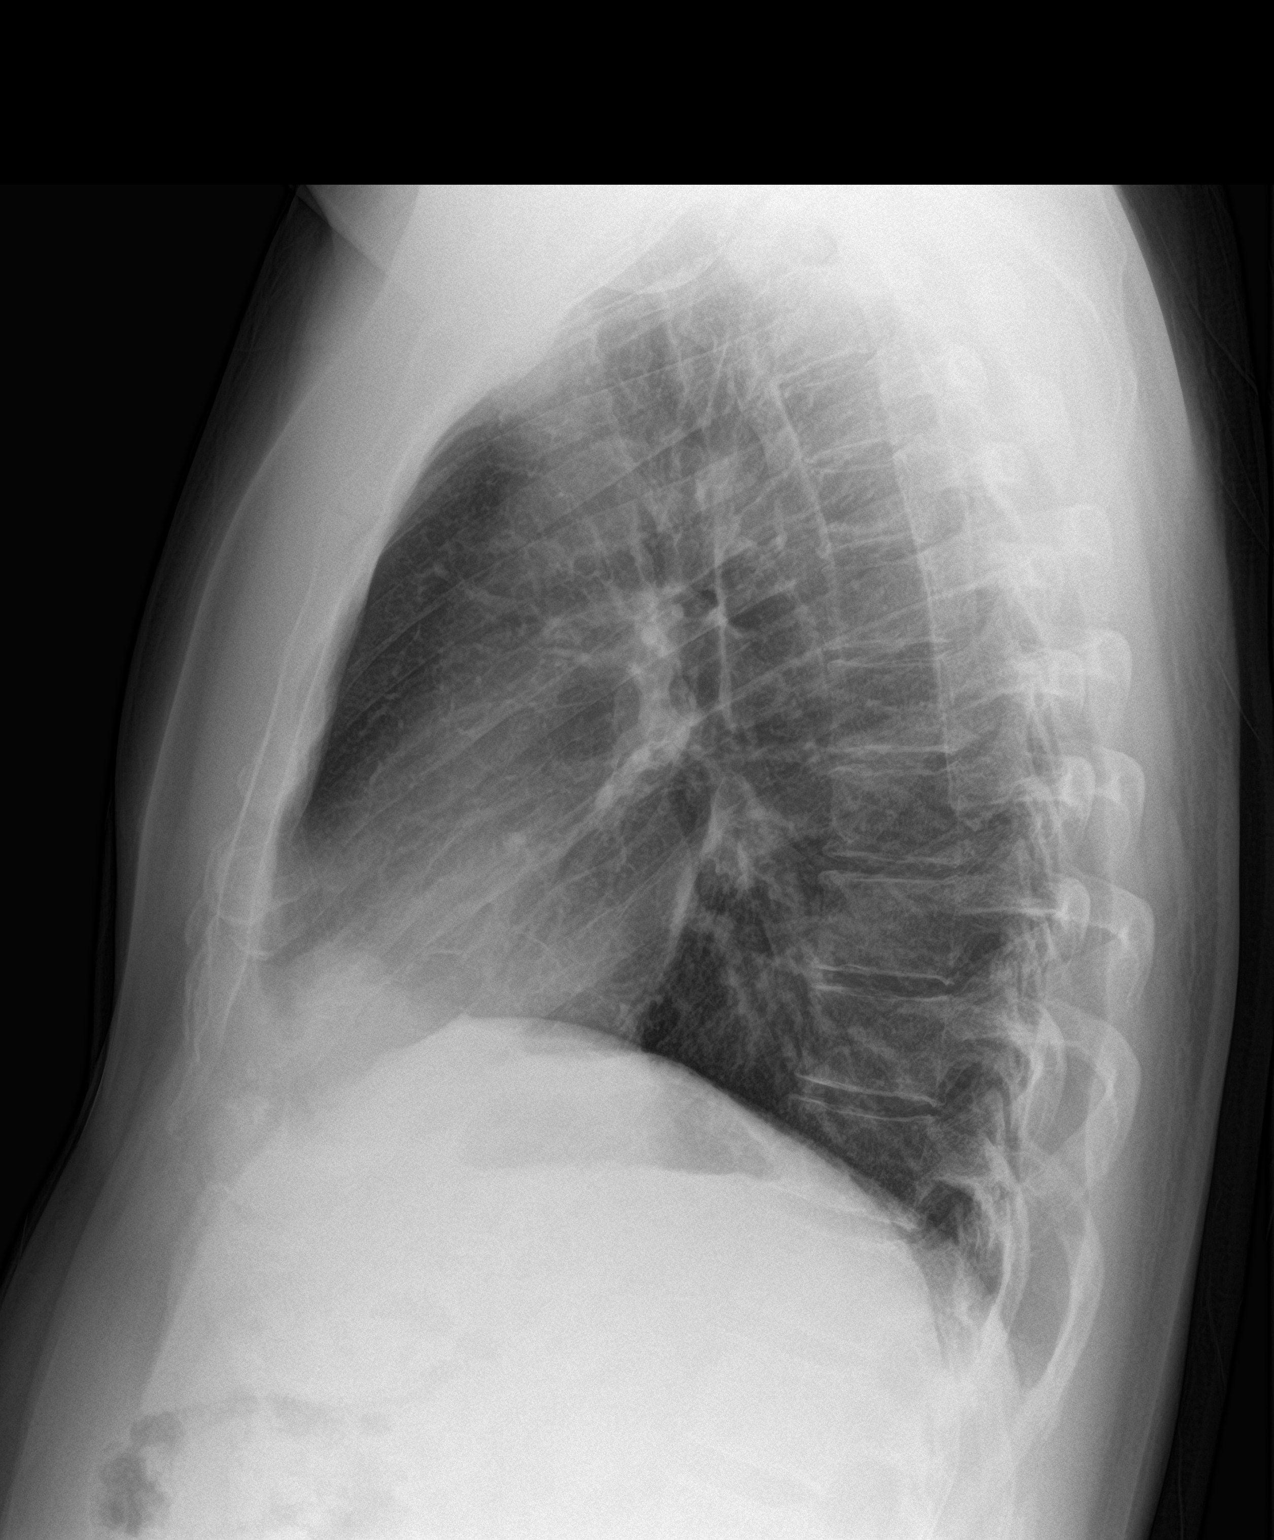

[2 of 2 positions shown; findings below may reference images not displayed]

FINDINGS: The heart size and mediastinal contours are within normal limits.
Normal pulmonary vascularity. Minimal scarring at the peripheral
left lung base. No focal consolidation, pleural effusion, or
pneumothorax. No acute osseous abnormality.
IMPRESSION: No active cardiopulmonary disease.

## 2020-01-29 ENCOUNTER — Ambulatory Visit: Payer: BC Managed Care – PPO | Attending: Internal Medicine

## 2020-01-29 DIAGNOSIS — Z23 Encounter for immunization: Secondary | ICD-10-CM

## 2020-01-29 NOTE — Progress Notes (Signed)
   Covid-19 Vaccination Clinic  Name:  Martin Becker    MRN: [id number] DOB: 15-Nov-1965  01/29/2020  Martin Becker was observed post Covid-19 immunization for 15 minutes without incident. He was provided with Vaccine Information Sheet and instruction to access the V-Safe system.   Martin Becker was instructed to call 911 with any severe reactions post vaccine: Marland Kitchen Difficulty breathing  . Swelling of face and throat  . A fast heartbeat  . A bad rash all over body  . Dizziness and weakness   Immunizations Administered    Name Date Dose VIS Date Route   Pfizer COVID-19 Vaccine 01/29/2020  2:09 PM 0.3 mL 10/06/2019 Intramuscular   Manufacturer: [employer], [employer]   Lot: [id number]   NDC: [address]-0

## 2020-02-21 ENCOUNTER — Ambulatory Visit: Payer: BC Managed Care – PPO | Attending: Internal Medicine

## 2020-02-21 DIAGNOSIS — Z23 Encounter for immunization: Secondary | ICD-10-CM

## 2020-02-21 NOTE — Progress Notes (Signed)
   Covid-19 Vaccination Clinic  Name:  Martin Becker    MRN: [id number] DOB: 08-30-1966  02/21/2020  [name] was observed post Covid-19 immunization for 15 minutes without incident. He was provided with Vaccine Information Sheet and instruction to access the V-Safe system.   [name] was instructed to call 911 with any severe reactions post vaccine: Marland Kitchen Difficulty breathing  . Swelling of face and throat  . A fast heartbeat  . A bad rash all over body  . Dizziness and weakness   Immunizations Administered    Name Date Dose VIS Date Route   Pfizer COVID-19 Vaccine 02/21/2020 12:55 PM 0.3 mL 12/20/2018 Intramuscular   Manufacturer: [employer], [employer]   Lot: [id number]   NDC: [address]-0

## 2020-06-14 ENCOUNTER — Ambulatory Visit: Payer: BC Managed Care – PPO | Admitting: Urology

## 2020-06-14 ENCOUNTER — Encounter: Payer: Self-pay | Admitting: Urology

## 2020-06-14 ENCOUNTER — Other Ambulatory Visit: Payer: Self-pay

## 2020-06-14 VITALS — BP 137/94 | HR 82 | Ht 72.0 in | Wt 192.0 lb

## 2020-06-14 DIAGNOSIS — N4340 Spermatocele of epididymis, unspecified: Secondary | ICD-10-CM | POA: Diagnosis not present

## 2020-06-14 DIAGNOSIS — N400 Enlarged prostate without lower urinary tract symptoms: Secondary | ICD-10-CM | POA: Diagnosis not present

## 2020-06-14 NOTE — Progress Notes (Signed)
06/14/2020 3:25 PM   Martin Becker 11/23/1965 [id number]  Referring provider: No referring provider defined for this encounter.  Chief Complaint  Patient presents with  . Establish Care    HPI:  Martin Becker noted some right testicle pain over past few years. He had vasectomy in 2016. The right side has a pulling sensation. He's been doing a lot of driving and sitting which makes it worse. Moving around helps. He has low back pain and does exercise to stretch in the mornings. PSA in 2019 was 0.8. He has occasional frequency and weak stream. No gross hematuria.    PMH: Past Medical History:  Diagnosis Date  . Atrial fibrillation (HCC)    fixed by ablation  . Sleep apnea     Surgical History: Past Surgical History:  Procedure Laterality Date  . APPENDECTOMY    . heart ablation      Home Medications:  Allergies as of 06/14/2020      Reactions   Penicillins Nausea And Vomiting      Medication List       Accurate as of June 14, 2020  3:25 PM. If you have any questions, ask your nurse or doctor.        B-complex with vitamin C tablet Take 1 tablet by mouth daily.   ELDERBERRY PO Take by mouth.   gi cocktail Susp suspension Take 30 mLs by mouth once as needed for indigestion. Shake well.   MULTI VITAMIN MENS PO Take by mouth daily.   omeprazole 40 MG capsule Commonly known as: PRILOSEC Take 40 mg by mouth daily.   triamcinolone cream 0.1 % Commonly known as: KENALOG Apply topically 2 (two) times daily.       Allergies:  Allergies  Allergen Reactions  . Penicillins Nausea And Vomiting    Family History: No family history on file.  Social History:  reports that he has never smoked. He has never used smokeless tobacco. He reports that he does not drink alcohol and does not use drugs.   Physical Exam: BP (!) 137/94 (BP Location: Left Arm, Patient Position: Sitting, Cuff Size: Normal)   Pulse 82   Ht 6' (1.829 m)   Wt 192 lb (87.1 kg)   BMI  26.04 kg/m   Constitutional:  Alert and oriented, No acute distress. HEENT: [place] AT, moist mucus membranes.  Trachea midline, no masses. Cardiovascular: No clubbing, cyanosis, or edema. Respiratory: Normal respiratory effort, no increased work of breathing. GI: Abdomen is soft, nontender, nondistended, no abdominal masses GU: No CVA tenderness; GU: Penis uncircumcised, normal foreskin, testicles descended bilaterally and palpably normal, right testicle high riding, 10 mm spermatocele of right epididymal head, left epididymis palpably normal, scrotum normal DRE: Prostate 30 g, smooth without hard area or nodule Lymph: No cervical or inguinal lymphadenopathy. Skin: No rashes, bruises or suspicious lesions. Neurologic: Grossly intact, no focal deficits, moving all 4 extremities. Psychiatric: Normal mood and affect.  Laboratory Data: Lab Results  Component Value Date   WBC 8.4 05/08/2018   HGB 15.7 05/08/2018   HCT 43.7 05/08/2018   MCV 83.8 05/08/2018   PLT 200 05/08/2018    Lab Results  Component Value Date   CREATININE 1.02 05/08/2018    No results found for: PSA  No results found for: TESTOSTERONE  No results found for: HGBA1C  Urinalysis No results found for: COLORURINE, APPEARANCEUR, LABSPEC, PHURINE, GLUCOSEU, HGBUR, BILIRUBINUR, KETONESUR, PROTEINUR, UROBILINOGEN, NITRITE, LEUKOCYTESUR  No results found for: LABMICR, WBCUA, RBCUA, LABEPIT, MUCUS, BACTERIA  Pertinent Imaging: n/a No results found for this or any previous visit.  No results found for this or any previous visit.  No results found for this or any previous visit.  No results found for this or any previous visit.  No results found for this or any previous visit.  No results found for this or any previous visit.  No results found for this or any previous visit.  No results found for this or any previous visit.   Assessment & Plan:    BPH - no bothersome symptoms. I sent PSA.   Spermatocele -  likely a spermatocele on the right. Check scrotal US and f/u to review.   No follow-ups on file.  Jerilee Field, MD  [facility] [address] [place], [place] [address] [phone]

## 2020-06-14 NOTE — Patient Instructions (Signed)
  Spermatocele  A spermatocele is a fluid-filled sac (cyst) inside the scrotum. This type of cyst often forms at the top of the testicle where sperm is stored (epididymis). The cyst sometimes forms along the tube that carries sperm away from the epididymis (vas deferens). Spermatoceles are usually painless. Most cysts are small, but they can grow larger. Spermatoceles are not cancerous (are benign). What are the causes? The cause of this condition is not known. What are the signs or symptoms? In most cases, small cysts do not cause symptoms. If you do have symptoms, they may include:  Dull pain.  A feeling of heaviness.  An enlargement of your scrotum, if the cyst is large. How is this diagnosed? This condition is diagnosed based on a physical exam. You or your health care provider may notice the cyst when feeling your scrotum. Your provider may shine a light through (transilluminate) your scrotum to see if light will pass through the cyst. You may also have an ultrasound of your scrotum to rule out a tumor. How is this treated? Small spermatoceles do not need to be treated. If the spermatocele has grown large or is uncomfortable, surgery to remove the cyst may be recommended. Follow these instructions at home:  Watch your spermatocele for any changes.  Keep all follow-up visits as told by your health care provider. This is important. Contact a health care provider if:  Your spermatocele gets larger.  You have pain in your scrotum.  Your spermatocele comes back after treatment. This information is not intended to replace advice given to you by your health care provider. Make sure you discuss any questions you have with your health care provider. Document Revised: 09/24/2017 Document Reviewed: 10/27/2015 Elsevier Patient Education  2020 [employer]  

## 2020-06-15 LAB — PSA: Prostate Specific Ag, Serum: 0.8 ng/mL (ref 0.0–4.0)

## 2020-06-18 ENCOUNTER — Encounter: Payer: Self-pay | Admitting: Urology

## 2020-06-20 ENCOUNTER — Ambulatory Visit: Payer: BC Managed Care – PPO

## 2020-07-12 ENCOUNTER — Ambulatory Visit: Payer: Self-pay | Admitting: Urology

## 2020-07-15 ENCOUNTER — Other Ambulatory Visit: Payer: Self-pay

## 2020-07-15 ENCOUNTER — Ambulatory Visit
Admission: RE | Admit: 2020-07-15 | Discharge: 2020-07-15 | Disposition: A | Discharge status: Home / Self Care | Payer: BC Managed Care – PPO | Source: Ambulatory Visit | Attending: Urology | Admitting: Urology

## 2020-07-15 DIAGNOSIS — N4340 Spermatocele of epididymis, unspecified: Secondary | ICD-10-CM | POA: Insufficient documentation

## 2020-07-23 ENCOUNTER — Telehealth: Payer: Self-pay | Admitting: Urology

## 2020-07-23 NOTE — Telephone Encounter (Signed)
Patient called the office today.  He wants to cancel his follow up on Friday.  He received his ultrasound results on Mychart and everything seems to be normal.  He is requesting a quick phone call at [phone] as opposed to paying a $95 co-pay for an office visit.  If he needs to come into the office, please have the staff call him to put him back on the schedule.

## 2020-07-26 ENCOUNTER — Ambulatory Visit: Payer: Self-pay | Admitting: Urology

## 2020-07-26 NOTE — Telephone Encounter (Signed)
Called pt per Dr.Eskridge and informed him that U/S normal F/U PRN

## 2021-01-27 IMAGING — US US SCROTUM W/ DOPPLER COMPLETE
1 series · 14 of 25 positions shown · non-contrast
Comparison: None.

CLINICAL DATA: Testicle pain. Epididymitis. Right testicular
tenderness and fullness x1 month

EXAM:
SCROTAL ULTRASOUND
DOPPLER ULTRASOUND OF THE TESTICLES
TECHNIQUE: Complete ultrasound examination of the testicles, epididymis, and
other scrotal structures was performed. Color and spectral Doppler
ultrasound were also utilized to evaluate blood flow to the
testicles.

[Series 1: us scrotum w/ doppler complete · 0.07mm/px · 119 acquisitions, 14 frames shown]
[im 1/119]
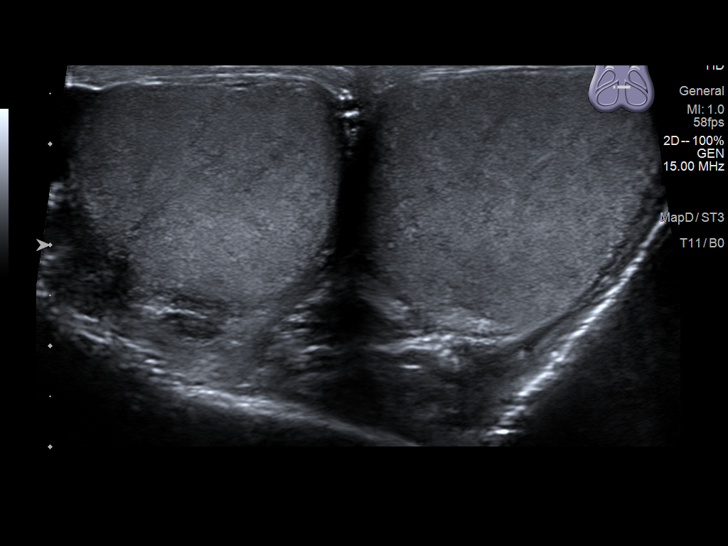
[im 10/119]
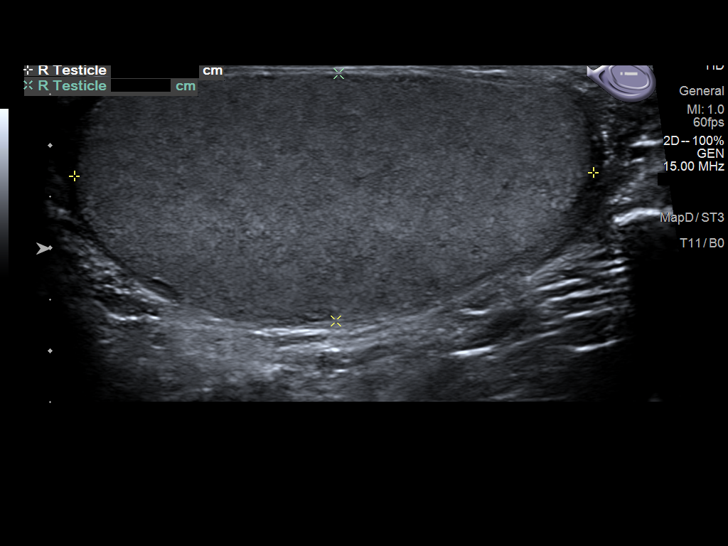
[im 20/119]
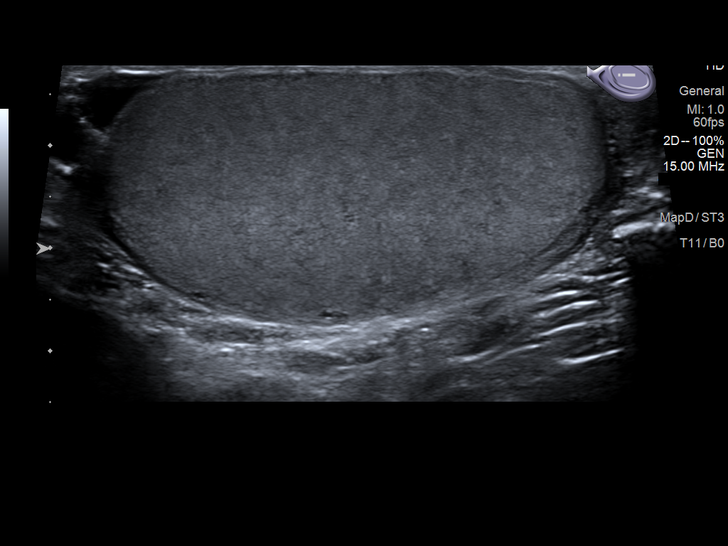
[im 30/119]
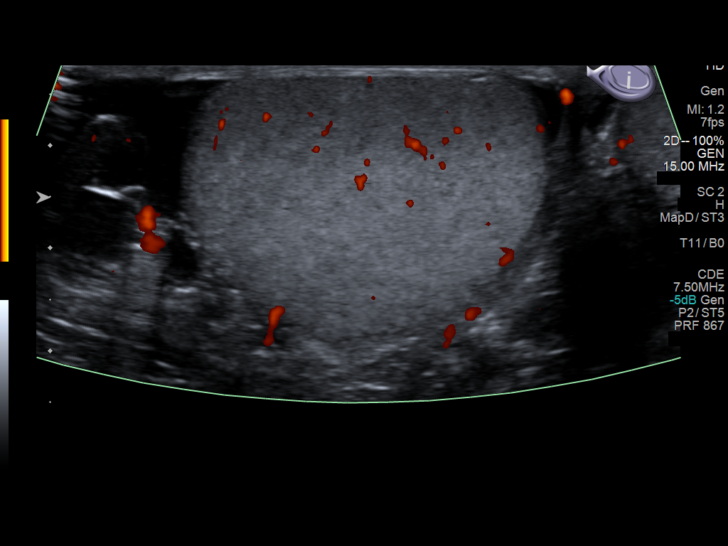
[im 40/119]
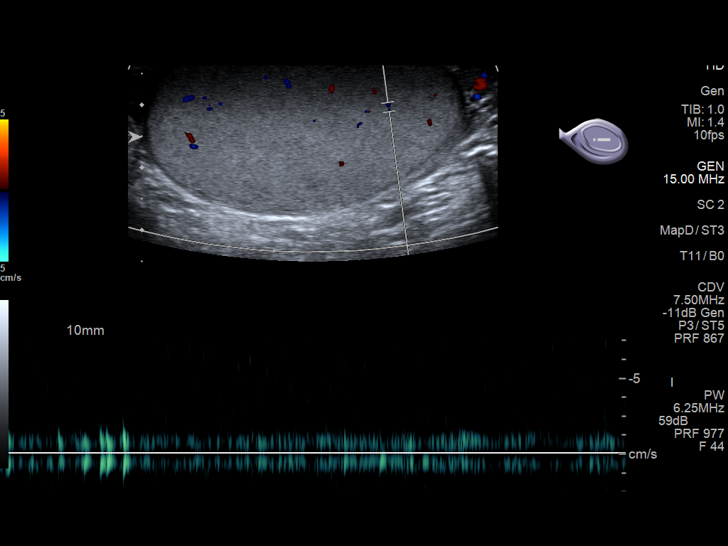
[im 45/119]
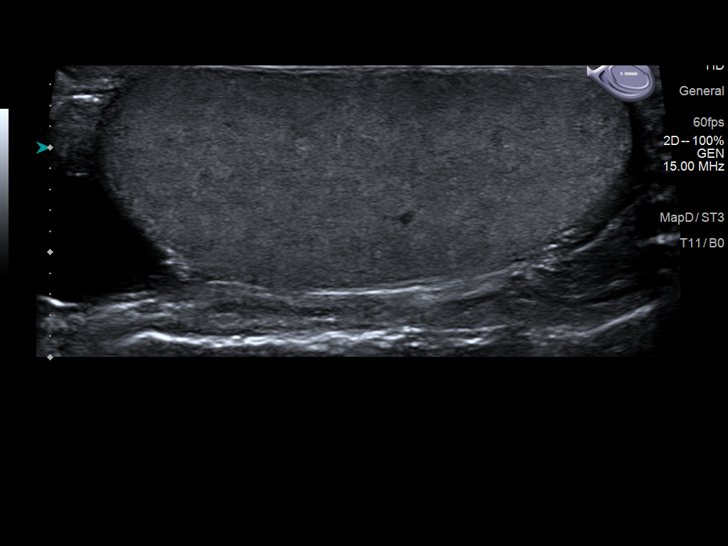
[im 55/119]
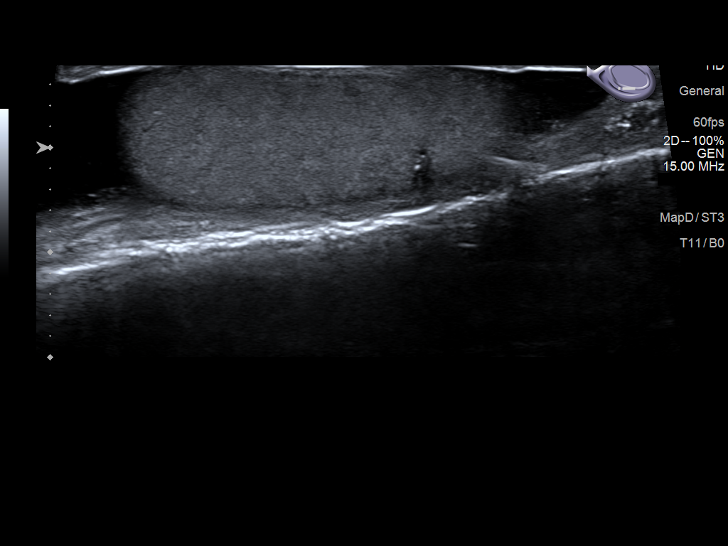
[im 64/119]
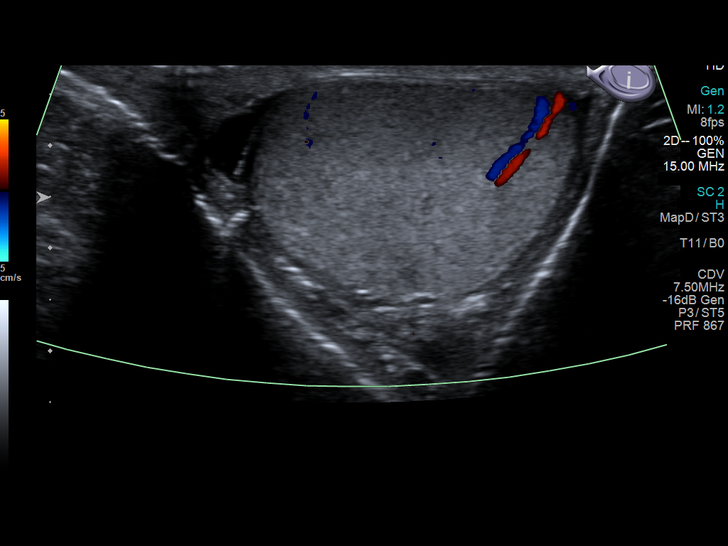
[im 74/119]
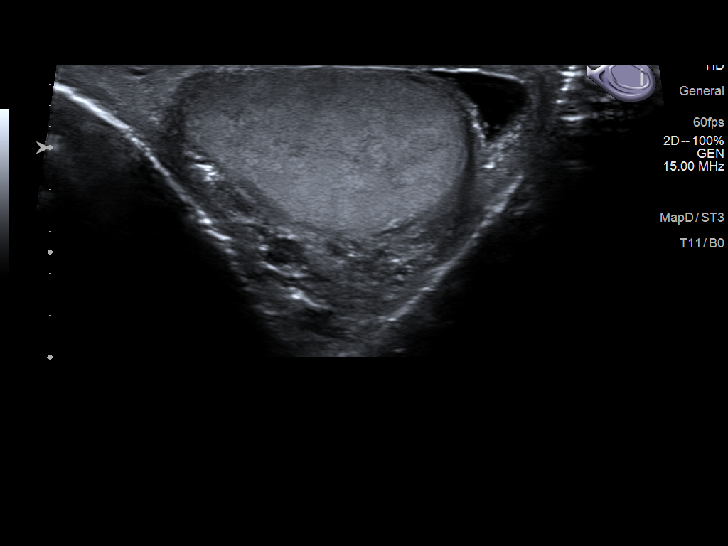
[im 79/119]
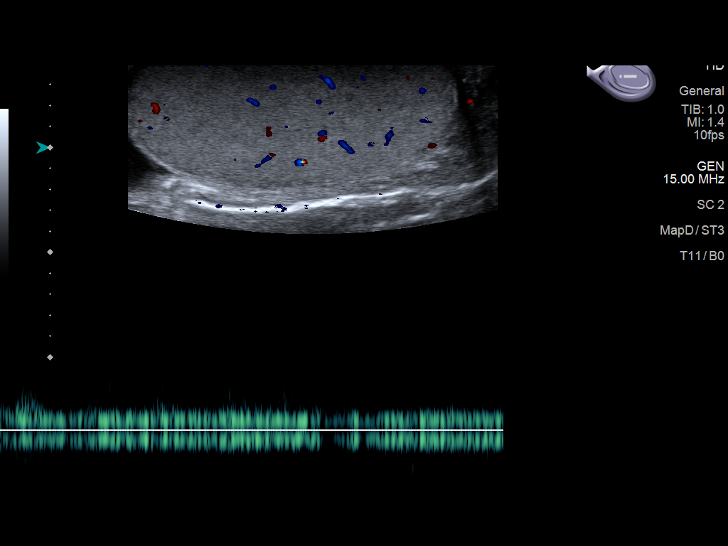
[im 89/119]
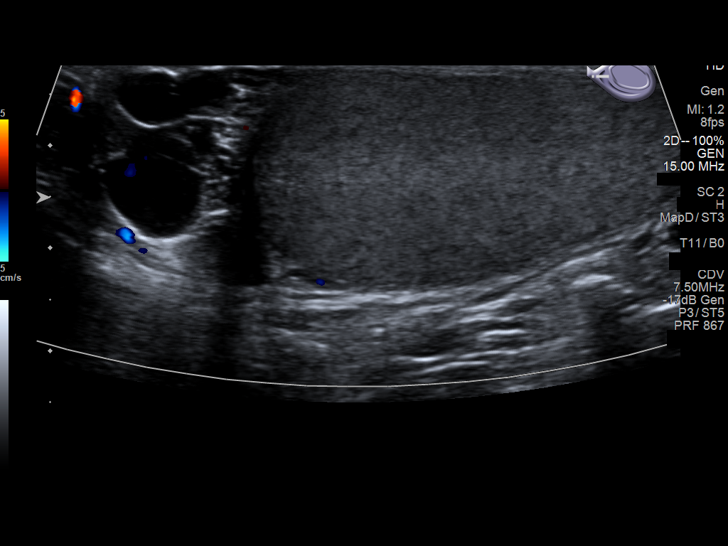
[im 99/119]
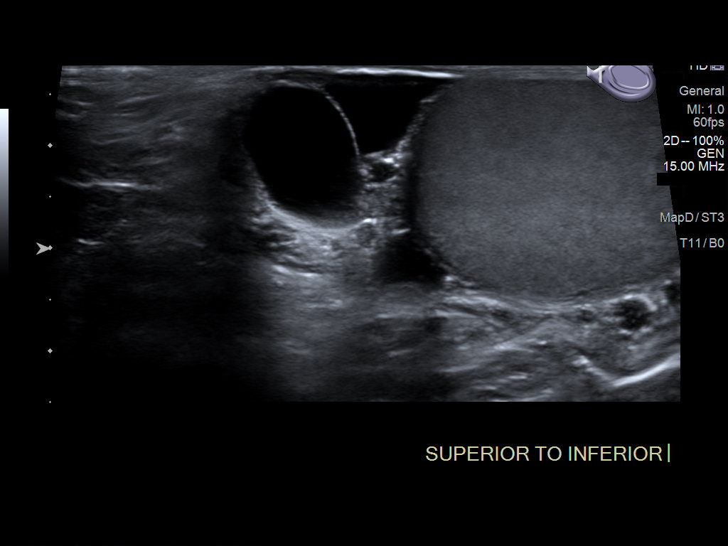
[im 109/119]
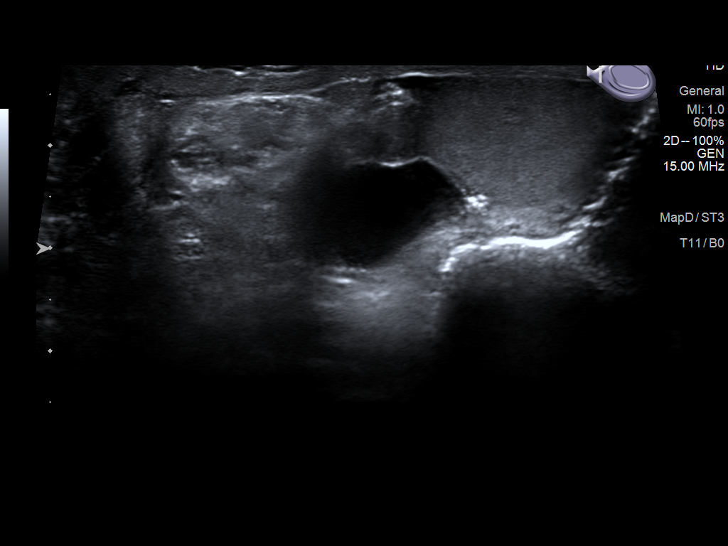
[im 119/119]
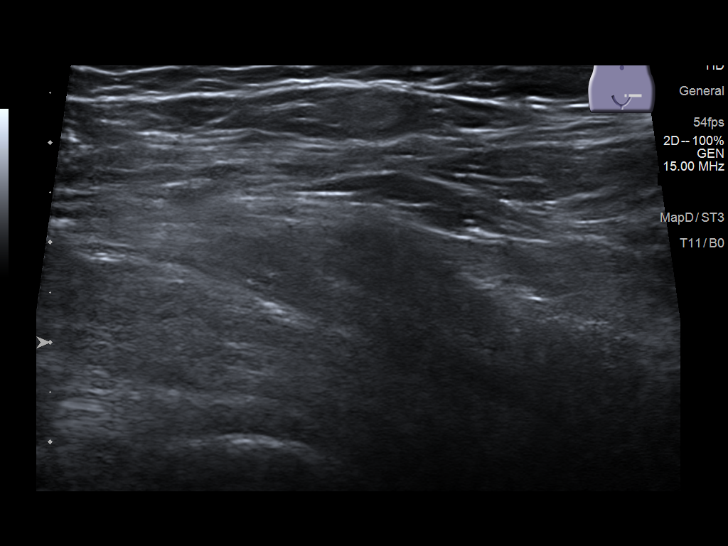

[14 of 25 positions shown; findings below may reference images not displayed]

FINDINGS: Right testicle

Measurements: 5.1 x 2.4 x 3.3 cm. No mass or microlithiasis
visualized.

Left testicle

Measurements: 5.2 x 2.1 x 3.2 cm. No mass or microlithiasis
visualized.

Right epididymis: There multiple right-sided epididymal head cyst
measuring up to approximately 1.4 cm

Left epididymis: There are multiple left-sided epididymal head cyst
measuring up to approximately 1.3 cm.

Hydrocele:  There is a small right-sided hydrocele.

Varicocele:  None visualized.

Pulsed Doppler interrogation of both testes demonstrates normal low
resistance arterial and venous waveforms bilaterally.
IMPRESSION: 1. No acute abnormality.
2. Bilateral epididymal head cysts as detailed above.
